# Patient Record
Sex: Male | Born: 1970 | Race: White | Hispanic: No | Marital: Single | Smoking: Current every day smoker
Health system: Southern US, Community
[De-identification: ages and names within clinical notes are randomized; demographics above are authoritative.]

## PROBLEM LIST (undated history)

## (undated) HISTORY — PX: APPENDECTOMY: SHX54

---

## 2012-05-10 ENCOUNTER — Emergency Department (HOSPITAL_COMMUNITY)
Admission: EM | Admit: 2012-05-10 | Discharge: 2012-05-10 | Disposition: A | Discharge status: Home / Self Care | Payer: Self-pay | Attending: Emergency Medicine | Admitting: Emergency Medicine

## 2012-05-10 ENCOUNTER — Encounter (HOSPITAL_COMMUNITY): Payer: Self-pay

## 2012-05-10 ENCOUNTER — Emergency Department (HOSPITAL_COMMUNITY): Payer: Self-pay

## 2012-05-10 DIAGNOSIS — IMO0002 Reserved for concepts with insufficient information to code with codable children: Secondary | ICD-10-CM | POA: Insufficient documentation

## 2012-05-10 DIAGNOSIS — Y92009 Unspecified place in unspecified non-institutional (private) residence as the place of occurrence of the external cause: Secondary | ICD-10-CM | POA: Insufficient documentation

## 2012-05-10 DIAGNOSIS — F17200 Nicotine dependence, unspecified, uncomplicated: Secondary | ICD-10-CM | POA: Insufficient documentation

## 2012-05-10 DIAGNOSIS — S62606A Fracture of unspecified phalanx of right little finger, initial encounter for closed fracture: Secondary | ICD-10-CM

## 2012-05-10 DIAGNOSIS — Y9389 Activity, other specified: Secondary | ICD-10-CM | POA: Insufficient documentation

## 2012-05-10 MED ORDER — PROMETHAZINE HCL 12.5 MG PO TABS
12.5000 mg | ORAL_TABLET | Freq: Once | ORAL | Status: AC
  Administered 2012-05-10: 12.5 mg via ORAL
  Filled 2012-05-10: qty 1

## 2012-05-10 MED ORDER — KETOROLAC TROMETHAMINE 10 MG PO TABS
10.0000 mg | ORAL_TABLET | Freq: Once | ORAL | Status: AC
  Administered 2012-05-10: 10 mg via ORAL
  Filled 2012-05-10: qty 1

## 2012-05-10 MED ORDER — OXYCODONE-ACETAMINOPHEN 5-325 MG PO TABS
1.0000 | ORAL_TABLET | Freq: Once | ORAL | Status: AC
  Administered 2012-05-10: 1 via ORAL
  Filled 2012-05-10: qty 1

## 2012-05-10 MED ORDER — OXYCODONE-ACETAMINOPHEN 5-325 MG PO TABS: 1.0000 | ORAL_TABLET | Freq: Four times a day (QID) | ORAL | Status: AC | PRN

## 2012-05-10 MED ORDER — MELOXICAM 7.5 MG PO TABS: ORAL_TABLET | ORAL | Status: DC

## 2012-05-10 NOTE — ED Provider Notes (Signed)
History     CSN: [id number]  Arrival date & time 05/10/12  1434   First MD Initiated Contact with Patient 05/10/12 1512      Chief Complaint  Patient presents with  . Hand Injury    (Consider location/radiation/quality/duration/timing/severity/associated sxs/prior treatment) Patient is a 42 y.o. male presenting with hand injury. The history is provided by the patient.  Hand Injury  The incident occurred yesterday. The incident occurred at home. The injury mechanism was a direct blow (A ratchet she slipped while the patient was working on a trailer and injured the right hand). The pain is present in the right hand. The quality of the pain is described as aching and throbbing. The pain is severe. The pain has been worsening since the incident. Pertinent negatives include no fever and no malaise/fatigue. He reports no foreign bodies present. The symptoms are aggravated by movement and palpation. He has tried NSAIDs for the symptoms. The treatment provided no relief.    History reviewed. No pertinent past medical history.  Past Surgical History  Procedure Date  . Appendectomy     No family history on file.  History  Substance Use Topics  . Smoking status: Current Every Day Smoker  . Smokeless tobacco: Not on file  . Alcohol Use: Yes      Review of Systems  Constitutional: Negative for fever, malaise/fatigue and activity change.       All ROS Neg except as noted in HPI  HENT: Negative for nosebleeds and neck pain.   Eyes: Negative for photophobia and discharge.  Respiratory: Negative for cough, shortness of breath and wheezing.   Cardiovascular: Negative for chest pain and palpitations.  Gastrointestinal: Negative for abdominal pain and blood in stool.  Genitourinary: Negative for dysuria, frequency and hematuria.  Musculoskeletal: Negative for back pain and arthralgias.       Hand pain  Skin: Negative.   Neurological: Negative for dizziness, seizures and speech  difficulty.  Psychiatric/Behavioral: Negative for hallucinations and confusion.    Allergies  Review of patient's allergies indicates not on file.  Home Medications   Current Outpatient Rx  Name  Route  Sig  Dispense  Refill  . IBUPROFEN 200 MG PO TABS   Oral   Take 400-800 mg by mouth every 3 (three) hours as needed. For pain         . MELOXICAM 7.5 MG PO TABS      1 po bid with food   12 tablet   0   . OXYCODONE-ACETAMINOPHEN 5-325 MG PO TABS   Oral   Take 1 tablet by mouth every 6 (six) hours as needed for pain.   20 tablet   0     BP 146/86  Pulse 69  Temp 98.3 F (36.8 C) (Oral)  Resp 20  Ht 5\' 11"  (1.803 m)  Wt 220 lb (99.791 kg)  BMI 30.68 kg/m2  SpO2 100%  Physical Exam  Nursing note and vitals reviewed. Constitutional: He is oriented to person, place, and time. He appears well-developed and well-nourished.  Non-toxic appearance.  HENT:  Head: Normocephalic.  Right Ear: Tympanic membrane and external ear normal.  Left Ear: Tympanic membrane and external ear normal.  Eyes: EOM and lids are normal. Pupils are equal, round, and reactive to light.  Neck: Normal range of motion. Neck supple. Carotid bruit is not present.  Cardiovascular: Normal rate, regular rhythm, normal heart sounds, intact distal pulses and normal pulses.   Pulmonary/Chest: Breath sounds normal. No respiratory  distress.  Abdominal: Soft. Bowel sounds are normal. There is no tenderness. There is no guarding.  Musculoskeletal: Normal range of motion.       There is swelling of the right fourth and fifth finger. There is severe pain to palpation or any movement of the right fifth finger. There is pain to the dorsum of the right hand extending into the right wrist forearm. There is bruising over the dorsum of the metacarpal phalangeal joint #4 and 5. Capillary refill is less than 3 seconds. Radial pulses are 2+ and symmetrical.  Lymphadenopathy:       Head (right side): No submandibular  adenopathy present.       Head (left side): No submandibular adenopathy present.    He has no cervical adenopathy.  Neurological: He is alert and oriented to person, place, and time. He has normal strength. No cranial nerve deficit or sensory deficit.  Skin: Skin is warm and dry.  Psychiatric: He has a normal mood and affect. His speech is normal.    ED Course  Procedures : FRACTURE CARE - patient identified by arm band. Permission for the procedure given by the patient. Procedural time out taken before fracture care to the right fifth finger.  The patient and significant other were educated on the fracture. Following this an ulnar gutter splint that included the DIP joint of the right fifth finger was applied. Patient received a sling. Patient received a pain medication. Patient tolerated the procedure without problem or incident.  Pulse oximetry 100% on room. Within normal limits by my interpretation. Labs Reviewed - No data to display Dg Hand Complete Right  05/10/2012  *RADIOLOGY REPORT*  Clinical Data: Hand injury, pain.  RIGHT HAND - COMPLETE 3+ VIEW  Comparison: None.  Findings: Comminuted fracture noted through the right fifth proximal phalanx.  There is extension of the fracture to near the MCP joint, possibly into the joint along the radial side.  Slight angulation and displacement.  IMPRESSION: Comminuted, slightly angulated and displaced fracture through the right fifth proximal phalanx.   Original Report Authenticated By: Charlett Nose, M.D.      1. Fracture of phalanx of right little finger       MDM  I have reviewed nursing notes, vital signs, and all appropriate lab and imaging results for this patient. The patient sustained injury to the right hand on yesterday January 5 when a ratchet slipped and hit the right hand. The x-ray reveals a comminuted slightly angulated and displaced fracture through the right fifth proximal phalanx. The patient is fitted with an ulnar gutter  splint and sling. Prescription for Percocet every 6 hours #20, and Mobic 7.5 mg twice a day #12 given to the patient. The patient is referred to  [facility]  for hand surgery referral.       Kathie Dike, [place] 05/10/12 1701

## 2012-05-10 NOTE — ED Provider Notes (Signed)
Medical screening examination/treatment/procedure(s) were performed by non-physician practitioner and as supervising physician I was immediately available for consultation/collaboration.  Erline Siddoway R. Zebulin Siegel, MD 05/10/12 2318 

## 2012-05-10 NOTE — ED Notes (Signed)
Pt reports hitting his hand on a ratchet yesterday.  Pt has bruising and some swelling to his hand and fingers.

## 2012-05-13 ENCOUNTER — Other Ambulatory Visit: Payer: Self-pay | Admitting: Orthopedic Surgery

## 2012-05-13 ENCOUNTER — Encounter (HOSPITAL_BASED_OUTPATIENT_CLINIC_OR_DEPARTMENT_OTHER): Payer: Self-pay | Admitting: *Deleted

## 2012-05-13 NOTE — Progress Notes (Signed)
To [facility] AT 0615-Hg on arrival .Npo after mn-refrain from smoking also-may take pain med if needed with small amt water only that am.Orders pending from Dr Janee Morn.

## 2012-05-14 NOTE — H&P (Signed)
Matthew Ray is an 42 y.o. male.   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  The [employer] he was using slipped and he slammed his hand forward into an object that didn't heal.  He is evaluated at the emergency department and read full, placed into a splint, and presents for additional evaluation and management.  X-rays from the time of injury are reviewed and reveal a comminuted fracture of the right small finger proximal phalanx with apex volar and radial angulation.  History reviewed. No pertinent past medical history.  Past Surgical History  Procedure Date  . Appendectomy     History reviewed. No pertinent family history. Social History:  reports that he has been smoking.  He does not have any smokeless tobacco history on file. He reports that he drinks alcohol. He reports that he does not use illicit drugs.  Allergies: No Known Allergies  No prescriptions prior to admission    No results found for this or any previous visit (from the past 48 hour(s)). No results found.  Review of Systems  All other systems reviewed and are negative.    Height 5\' 11"  (1.803 m), weight 99.791 kg (220 lb). Physical Exam  Vitals: Refer to EMR. Constitutional:  WD, WN, NAD HEENT:  NCAT, EOMI Neuro/Psych:  Alert & oriented to person, place, and time; appropriate mood & affect Lymphatic: No generalized UE edema or lymphadenopathy Extremities / MSK:  Both UE are normal with respect to appearance, ranges of motion, joint stability, muscle strength/tone, sensation, & perfusion except as otherwise noted:  Splint is removed the right hand.  The right small finger does not appear to have a significant malrotation deformity.  Overall it appears fairly straight, but flexion is slightly limited at the MP joint both by  pain and likely by extension through the fracture.  Labs / Xrays: 2 views of the right small finger obtained fluoroscopically reveal good alignment in the coronal plane, with 30 apex volar angulation still present.  Assessment:  Displaced and angulated right small finger proximal phalanx fracture  Plan:  Discussed these findings with the patient.  I recommended closed reduction and pinning versus open reduction if needed.  He understands.  We will attempt to do this on Monday as an outpatient.    The details of the operative procedure were discussed with the patient.  Questions were invited and answered.  In addition to the goal of the procedure, the risks of the procedure to include but not limited to bleeding; infection; damage to the nerves or blood vessels that could result in bleeding, numbness, weakness, chronic pain, and the need for additional procedures; stiffness; the need for revision surgery; and anesthetic risks, the worst of which is death, were reviewed.  No specific outcome was guaranteed or implied.  Informed consent was obtained.  Prescriptions for postoperative analgesia were also written.  A return appointment 7-10 days postop was made.  He was placed back into a splint awaiting surgery next week.   Work status: All interested parties should please consider this patient to be out of work entirely if no work is available that complies with the restrictions detailed below.  If these restrictions result in the patient being out of work entirely, the employer is expected to provide documentation of such to all interested third  parties such as disability insurance companies: No right-handed work for at least 2 weeks.  Math Brazie A. 05/14/2012, 4:52 PM

## 2012-05-17 ENCOUNTER — Ambulatory Visit (HOSPITAL_COMMUNITY): Payer: Self-pay

## 2012-05-17 ENCOUNTER — Ambulatory Visit (HOSPITAL_BASED_OUTPATIENT_CLINIC_OR_DEPARTMENT_OTHER): Payer: Self-pay | Admitting: Anesthesiology

## 2012-05-17 ENCOUNTER — Encounter (HOSPITAL_BASED_OUTPATIENT_CLINIC_OR_DEPARTMENT_OTHER)
Admission: RE | Disposition: A | Discharge status: Home / Self Care | Payer: Self-pay | Source: Ambulatory Visit | Attending: Orthopedic Surgery

## 2012-05-17 ENCOUNTER — Encounter (HOSPITAL_BASED_OUTPATIENT_CLINIC_OR_DEPARTMENT_OTHER): Payer: Self-pay

## 2012-05-17 ENCOUNTER — Encounter (HOSPITAL_BASED_OUTPATIENT_CLINIC_OR_DEPARTMENT_OTHER): Payer: Self-pay | Admitting: Anesthesiology

## 2012-05-17 ENCOUNTER — Ambulatory Visit (HOSPITAL_BASED_OUTPATIENT_CLINIC_OR_DEPARTMENT_OTHER)
Admission: RE | Admit: 2012-05-17 | Discharge: 2012-05-17 | Disposition: A | Discharge status: Home / Self Care | Payer: Self-pay | Source: Ambulatory Visit | Attending: Orthopedic Surgery | Admitting: Orthopedic Surgery

## 2012-05-17 DIAGNOSIS — Y990 Civilian activity done for income or pay: Secondary | ICD-10-CM | POA: Insufficient documentation

## 2012-05-17 DIAGNOSIS — W240XXA Contact with lifting devices, not elsewhere classified, initial encounter: Secondary | ICD-10-CM | POA: Insufficient documentation

## 2012-05-17 DIAGNOSIS — IMO0002 Reserved for concepts with insufficient information to code with codable children: Secondary | ICD-10-CM | POA: Insufficient documentation

## 2012-05-17 DIAGNOSIS — Y9389 Activity, other specified: Secondary | ICD-10-CM | POA: Insufficient documentation

## 2012-05-17 HISTORY — PX: CLOSED REDUCTION FINGER WITH PERCUTANEOUS PINNING: SHX5612

## 2012-05-17 LAB — POCT HEMOGLOBIN-HEMACUE: Hemoglobin: 15.6 g/dL (ref 13.0–17.0)

## 2012-05-17 SURGERY — CLOSED REDUCTION, FINGER, WITH PERCUTANEOUS PINNING
Anesthesia: General | Site: Finger | Laterality: Right | Wound class: Clean

## 2012-05-17 MED ORDER — OXYCODONE-ACETAMINOPHEN 5-325 MG PO TABS
1.0000 | ORAL_TABLET | ORAL | Status: DC | PRN
  Administered 2012-05-17: 1 via ORAL
  Filled 2012-05-17: qty 2

## 2012-05-17 MED ORDER — LACTATED RINGERS IV SOLN
INTRAVENOUS | Status: DC
  Administered 2012-05-17 (×3): via INTRAVENOUS
  Filled 2012-05-17: qty 1000

## 2012-05-17 MED ORDER — FENTANYL CITRATE 0.05 MG/ML IJ SOLN
25.0000 ug | INTRAMUSCULAR | Status: DC | PRN
  Administered 2012-05-17 (×3): 50 ug via INTRAVENOUS
  Filled 2012-05-17: qty 1

## 2012-05-17 MED ORDER — HYDROCODONE-ACETAMINOPHEN 5-325 MG PO TABS
1.0000 | ORAL_TABLET | ORAL | Status: DC | PRN
  Filled 2012-05-17: qty 2

## 2012-05-17 MED ORDER — MIDAZOLAM HCL 5 MG/5ML IJ SOLN
INTRAMUSCULAR | Status: DC | PRN
  Administered 2012-05-17: 2 mg via INTRAVENOUS

## 2012-05-17 MED ORDER — PROMETHAZINE HCL 25 MG/ML IJ SOLN
6.2500 mg | INTRAMUSCULAR | Status: DC | PRN
  Filled 2012-05-17: qty 1

## 2012-05-17 MED ORDER — FENTANYL CITRATE 0.05 MG/ML IJ SOLN
50.0000 ug | Freq: Once | INTRAMUSCULAR | Status: AC
  Administered 2012-05-17: 50 ug via INTRAVENOUS
  Filled 2012-05-17: qty 1

## 2012-05-17 MED ORDER — CEFAZOLIN SODIUM-DEXTROSE 2-3 GM-% IV SOLR
2.0000 g | INTRAVENOUS | Status: AC
  Administered 2012-05-17: 2 g via INTRAVENOUS
  Filled 2012-05-17: qty 50

## 2012-05-17 MED ORDER — FENTANYL CITRATE 0.05 MG/ML IJ SOLN
INTRAMUSCULAR | Status: DC | PRN
  Administered 2012-05-17 (×2): 50 ug via INTRAVENOUS
  Administered 2012-05-17: 25 ug via INTRAVENOUS
  Administered 2012-05-17: 50 ug via INTRAVENOUS
  Administered 2012-05-17: 25 ug via INTRAVENOUS

## 2012-05-17 MED ORDER — ONDANSETRON HCL 4 MG/2ML IJ SOLN
INTRAMUSCULAR | Status: DC | PRN
  Administered 2012-05-17: 4 mg via INTRAVENOUS

## 2012-05-17 MED ORDER — MEPERIDINE HCL 25 MG/ML IJ SOLN
6.2500 mg | INTRAMUSCULAR | Status: DC | PRN
  Filled 2012-05-17: qty 1

## 2012-05-17 MED ORDER — HYDROMORPHONE HCL PF 1 MG/ML IJ SOLN
0.5000 mg | INTRAMUSCULAR | Status: DC | PRN
  Filled 2012-05-17: qty 1

## 2012-05-17 MED ORDER — PROPOFOL 10 MG/ML IV BOLUS
INTRAVENOUS | Status: DC | PRN
  Administered 2012-05-17: 300 mg via INTRAVENOUS

## 2012-05-17 MED ORDER — DEXAMETHASONE SODIUM PHOSPHATE 4 MG/ML IJ SOLN
INTRAMUSCULAR | Status: DC | PRN
  Administered 2012-05-17: 10 mg via INTRAVENOUS

## 2012-05-17 MED ORDER — LACTATED RINGERS IV SOLN
INTRAVENOUS | Status: DC
  Filled 2012-05-17: qty 1000

## 2012-05-17 MED ORDER — LIDOCAINE HCL (CARDIAC) 20 MG/ML IV SOLN
INTRAVENOUS | Status: DC | PRN
  Administered 2012-05-17: 80 mg via INTRAVENOUS

## 2012-05-17 SURGICAL SUPPLY — 51 items
BANDAGE COBAN STERILE 2 (GAUZE/BANDAGES/DRESSINGS) IMPLANT
BANDAGE CONFORM 2  STR LF (GAUZE/BANDAGES/DRESSINGS) IMPLANT
BANDAGE CONFORM 3  STR LF (GAUZE/BANDAGES/DRESSINGS) IMPLANT
BANDAGE ELASTIC 6 VELCRO ST LF (GAUZE/BANDAGES/DRESSINGS) ×2 IMPLANT
BANDAGE GAUZE ELAST BULKY 4 IN (GAUZE/BANDAGES/DRESSINGS) ×2 IMPLANT
BLADE MINI RND TIP GREEN BEAV (BLADE) IMPLANT
BLADE OSC/SAG .038X5.5 CUT EDG (BLADE) IMPLANT
BLADE SURG 15 STRL LF DISP TIS (BLADE) ×1 IMPLANT
BLADE SURG 15 STRL SS (BLADE) ×1
BNDG COHESIVE 1X5 TAN STRL LF (GAUZE/BANDAGES/DRESSINGS) IMPLANT
BNDG COHESIVE 4X5 TAN NS LF (GAUZE/BANDAGES/DRESSINGS) ×2 IMPLANT
BNDG ESMARK 4X9 LF (GAUZE/BANDAGES/DRESSINGS) IMPLANT
CANISTER SUCTION 1200CC (MISCELLANEOUS) IMPLANT
CANISTER SUCTION 2500CC (MISCELLANEOUS) IMPLANT
CHLORAPREP W/TINT 26ML (MISCELLANEOUS) ×2 IMPLANT
CLOTH BEACON ORANGE TIMEOUT ST (SAFETY) ×2 IMPLANT
CORDS BIPOLAR (ELECTRODE) IMPLANT
COVER MAYO STAND STRL (DRAPES) ×2 IMPLANT
COVER TABLE BACK 60X90 (DRAPES) ×2 IMPLANT
DRAPE EXTREMITY T 121X128X90 (DRAPE) ×2 IMPLANT
DRAPE U-SHAPE 47X51 STRL (DRAPES) ×2 IMPLANT
DRSG EMULSION OIL 3X3 NADH (GAUZE/BANDAGES/DRESSINGS) ×4 IMPLANT
ELECT NEEDLE BLADE 2-5/6 (NEEDLE) IMPLANT
GLOVE BIO SURGEON STRL SZ 6.5 (GLOVE) ×4 IMPLANT
GLOVE BIO SURGEON STRL SZ7.5 (GLOVE) ×2 IMPLANT
GLOVE INDICATOR 8.0 STRL GRN (GLOVE) ×2 IMPLANT
GOWN PREVENTION PLUS LG XLONG (DISPOSABLE) ×2 IMPLANT
GOWN PREVENTION PLUS XLARGE (GOWN DISPOSABLE) ×2 IMPLANT
GOWN SURGICAL LARGE (GOWNS) ×2 IMPLANT
K-WIRE PROS .028 4 (WIRE) IMPLANT
KWIRE 4.0 X .045IN (WIRE) ×2 IMPLANT
KWIRE 4.0 X .062IN (WIRE) IMPLANT
NEEDLE HYPO 25X1 1.5 SAFETY (NEEDLE) IMPLANT
NS IRRIG 500ML POUR BTL (IV SOLUTION) IMPLANT
PACK BASIN DAY SURGERY FS (CUSTOM PROCEDURE TRAY) ×2 IMPLANT
PADDING CAST ABS 3INX4YD NS (CAST SUPPLIES) ×1
PADDING CAST ABS 4INX4YD NS (CAST SUPPLIES) ×1
PADDING CAST ABS COTTON 3X4 (CAST SUPPLIES) ×1 IMPLANT
PADDING CAST ABS COTTON 4X4 ST (CAST SUPPLIES) ×1 IMPLANT
SLEEVE SCD COMPRESS KNEE MED (MISCELLANEOUS) IMPLANT
SLING ARM FOAM STRAP LRG (SOFTGOODS) IMPLANT
SLING ARM FOAM STRAP MED (SOFTGOODS) IMPLANT
SPLINT PLASTER CAST XFAST 3X15 (CAST SUPPLIES) IMPLANT
SPLINT PLASTER CAST XFAST 4X15 (CAST SUPPLIES) ×8 IMPLANT
SPLINT PLASTER XTRA FAST SET 4 (CAST SUPPLIES) ×8
SPLINT PLASTER XTRA FASTSET 3X (CAST SUPPLIES)
SPONGE GAUZE 4X4 12PLY (GAUZE/BANDAGES/DRESSINGS) ×2 IMPLANT
SUT VICRYL RAPIDE 4/0 PS 2 (SUTURE) IMPLANT
SYRINGE 10CC LL (SYRINGE) ×2 IMPLANT
UNDERPAD 30X30 INCONTINENT (UNDERPADS AND DIAPERS) ×2 IMPLANT
WATER STERILE IRR 500ML POUR (IV SOLUTION) ×2 IMPLANT

## 2012-05-17 NOTE — Transfer of Care (Signed)
Immediate Anesthesia Transfer of Care Note  Patient: Matthew Ray  Procedure(s) Performed: Procedure(s) (LRB): CLOSED REDUCTION FINGER WITH PERCUTANEOUS PINNING (Right)  Patient Location: Patient transported to PACU with oxygen via face mask at 4 Liters / Min  Anesthesia Type: General  Level of Consciousness: awake and alert   Airway & Oxygen Therapy: Patient Spontanous Breathing and Patient connected to face mask oxygen  Post-op Assessment: Report given to PACU RN and Post -op Vital signs reviewed and stable  Post vital signs: Reviewed and stable  Dentition: Teeth and oropharynx remain in pre-op condition  Complications: No apparent anesthesia complications

## 2012-05-17 NOTE — Anesthesia Preprocedure Evaluation (Addendum)
Anesthesia Evaluation  Patient identified by MRN, date of birth, ID band Patient awake    Reviewed: Allergy & Precautions, H&P , NPO status , Patient's Chart, lab work & pertinent test results  Airway Mallampati: II TM Distance: >3 FB Neck ROM: Full    Dental No notable dental hx. (+) Partial Upper   Pulmonary Current Smoker,  breath sounds clear to auscultation  Pulmonary exam normal       Cardiovascular negative cardio ROS  Rhythm:Regular Rate:Normal     Neuro/Psych negative neurological ROS  negative psych ROS   GI/Hepatic negative GI ROS, Neg liver ROS,   Endo/Other  negative endocrine ROS  Renal/GU negative Renal ROS  negative genitourinary   Musculoskeletal negative musculoskeletal ROS (+)   Abdominal   Peds negative pediatric ROS (+)  Hematology negative hematology ROS (+)   Anesthesia Other Findings   Reproductive/Obstetrics negative OB ROS                          Anesthesia Physical Anesthesia Plan  ASA: II  Anesthesia Plan: General   Post-op Pain Management:    Induction: Intravenous  Airway Management Planned: LMA  Additional Equipment:   Intra-op Plan:   Post-operative Plan: Extubation in OR  Informed Consent: I have reviewed the patients History and Physical, chart, labs and discussed the procedure including the risks, benefits and alternatives for the proposed anesthesia with the patient or authorized representative who has indicated his/her understanding and acceptance.   Dental advisory given  Plan Discussed with: CRNA  Anesthesia Plan Comments:         Anesthesia Quick Evaluation

## 2012-05-17 NOTE — Anesthesia Procedure Notes (Signed)
Procedure Name: LMA Insertion Date/Time: 05/17/2012 8:18 AM Performed by: Fran Lowes Pre-anesthesia Checklist: Patient identified, Emergency Drugs available, Suction available and Patient being monitored Patient Re-evaluated:Patient Re-evaluated prior to inductionOxygen Delivery Method: Circle System Utilized Preoxygenation: Pre-oxygenation with 100% oxygen Intubation Type: IV induction Ventilation: Mask ventilation without difficulty LMA: LMA inserted LMA Size: 5.0 Number of attempts: 1 Airway Equipment and Method: bite block Placement Confirmation: positive ETCO2 Tube secured with: Tape Dental Injury: Teeth and Oropharynx as per pre-operative assessment

## 2012-05-17 NOTE — Anesthesia Postprocedure Evaluation (Signed)
  Anesthesia Post-op Note  Patient: Matthew Ray  Procedure(s) Performed: Procedure(s) (LRB): CLOSED REDUCTION FINGER WITH PERCUTANEOUS PINNING (Right)  Patient Location: PACU  Anesthesia Type: General  Level of Consciousness: awake and alert   Airway and Oxygen Therapy: Patient Spontanous Breathing  Post-op Pain: mild  Post-op Assessment: Post-op Vital signs reviewed, Patient's Cardiovascular Status Stable, Respiratory Function Stable, Patent Airway and No signs of Nausea or vomiting  Last Vitals:  Filed Vitals:   05/17/12 0945  BP: 146/88  Pulse: 65  Temp:   Resp: 14    Post-op Vital Signs: stable   Complications: No apparent anesthesia complications

## 2012-05-17 NOTE — Op Note (Signed)
05/17/2012  9:03 AM  PATIENT:  Matthew Ray  42 y.o. male  PRE-OPERATIVE DIAGNOSIS:  Displaced right small finger proximal phalanx fracture  POST-OPERATIVE DIAGNOSIS:  Same  PROCEDURE:  CRPP  right small finger proximal phalanx fracture  SURGEON: Cliffton Asters. Janee Morn, MD  PHYSICIAN ASSISTANT: None  ANESTHESIA:  general  SPECIMENS:  None  DRAINS:   None  PREOPERATIVE INDICATIONS:  Matthew Ray is a  42 y.o. male with a diagnosis of a displaced right small finger proximal phalanx fracture, unacceptable for continued treatment in that alignment  The risks benefits and alternatives were discussed with the patient preoperatively including but not limited to the risks of infection, bleeding, nerve injury, cardiopulmonary complications, the need for revision surgery, among others, and the patient verbalized understanding and consented to proceed.  OPERATIVE IMPLANTS: 2 different 0.045 inch K wires in a crossed configuration  OPERATIVE FINDINGS: Near anatomic reduction following closed reduction and secured with percutaneous pinning  OPERATIVE PROCEDURE:  After receiving prophylactic antibiotics, the patient was escorted to the operative theatre and placed in a supine position.  General anesthesia was a Optician, dispensing. A surgical "time-out" was performed during which the planned procedure, proposed operative site, and the correct patient identity were compared to the operative consent and agreement confirmed by the circulating nurse according to current facility policy.  The splint was removed and the limb was pre-scrub with Hibiclens scrub brush. Following application of a tourniquet to the operative extremity, the exposed skin was prepped with Chloraprep and draped in the usual sterile fashion.  The limb was not exsanguinated.  Under fluoroscopic guidance, the fracture was manipulated and closed reduction could be obtained. 2 different 0.045 inch K wires were introduced with the MP joints flexed.  These were introduced into the radial and ulnar aspects of the base of the proximal phalanx and secured the reduction in a crossed configuration. Final images were obtained. The pins were bent and clipped. A bulky hand dressing was applied with a volar plaster component. The digit had no malrotation following reduction and pinning.  DISPOSITION: The patient will be discharged home today with typical postop instructions, returning in 10-15 days for reassessment, with new x-rays of the right small finger out of the splint and transition to hand therapy to have a custom molded plastic splint constructed for protection and initiation of range of motion exercises.

## 2012-05-18 ENCOUNTER — Encounter (HOSPITAL_BASED_OUTPATIENT_CLINIC_OR_DEPARTMENT_OTHER): Payer: Self-pay | Admitting: Orthopedic Surgery

## 2016-02-28 ENCOUNTER — Emergency Department (HOSPITAL_COMMUNITY)
Admission: EM | Admit: 2016-02-28 | Discharge: 2016-02-28 | Disposition: A | Discharge status: Home / Self Care | Payer: Self-pay | Attending: Emergency Medicine | Admitting: Emergency Medicine

## 2016-02-28 ENCOUNTER — Emergency Department (HOSPITAL_COMMUNITY): Payer: Self-pay

## 2016-02-28 ENCOUNTER — Encounter (HOSPITAL_COMMUNITY): Payer: Self-pay | Admitting: Emergency Medicine

## 2016-02-28 DIAGNOSIS — Y939 Activity, unspecified: Secondary | ICD-10-CM | POA: Insufficient documentation

## 2016-02-28 DIAGNOSIS — M5441 Lumbago with sciatica, right side: Secondary | ICD-10-CM | POA: Insufficient documentation

## 2016-02-28 DIAGNOSIS — Y990 Civilian activity done for income or pay: Secondary | ICD-10-CM | POA: Insufficient documentation

## 2016-02-28 DIAGNOSIS — W1789XA Other fall from one level to another, initial encounter: Secondary | ICD-10-CM | POA: Insufficient documentation

## 2016-02-28 DIAGNOSIS — Y9289 Other specified places as the place of occurrence of the external cause: Secondary | ICD-10-CM | POA: Insufficient documentation

## 2016-02-28 DIAGNOSIS — M25531 Pain in right wrist: Secondary | ICD-10-CM | POA: Insufficient documentation

## 2016-02-28 DIAGNOSIS — F17200 Nicotine dependence, unspecified, uncomplicated: Secondary | ICD-10-CM | POA: Insufficient documentation

## 2016-02-28 MED ORDER — NAPROXEN 500 MG PO TABS: 500.0000 mg | ORAL_TABLET | Freq: Two times a day (BID) | ORAL | 0 refills | Status: DC

## 2016-02-28 MED ORDER — OXYCODONE-ACETAMINOPHEN 5-325 MG PO TABS
1.0000 | ORAL_TABLET | Freq: Once | ORAL | Status: AC
  Administered 2016-02-28: 1 via ORAL
  Filled 2016-02-28: qty 1

## 2016-02-28 MED ORDER — HYDROCODONE-ACETAMINOPHEN 5-325 MG PO TABS: 1.0000 | ORAL_TABLET | Freq: Four times a day (QID) | ORAL | 0 refills | Status: DC | PRN

## 2016-02-28 NOTE — ED Notes (Signed)
Patient transported to X-ray 

## 2016-02-28 NOTE — ED Triage Notes (Signed)
Pt. fell from a scaffolding at work this morning  , no LOC / ambulatory , reports pain at right wrist and lower back .

## 2016-02-28 NOTE — ED Notes (Signed)
NP at bedside.

## 2016-02-28 NOTE — ED Notes (Signed)
Pt back in room. No distress observed. 

## 2016-02-28 NOTE — ED Provider Notes (Signed)
[facility]-EMERGENCY DEPT Provider Note   CSN: [id number] Arrival date & time: 02/28/16  1950  By signing my name below, I, Linna Darner, attest that this documentation has been prepared under the direction and in the presence of Felicie Morn, NP. Electronically Signed: Linna Darner, Scribe. 02/28/2016. 8:06 PM.  History   Chief Complaint Chief Complaint  Patient presents with  . Fall    Back and Wrist pain      The history is provided by the patient. No language interpreter was used.  Fall  This is a new problem. The current episode started 6 to 12 hours ago. The problem occurs rarely. The problem has not changed since onset.Pertinent negatives include no chest pain, no abdominal pain, no headaches and no shortness of breath. Nothing aggravates the symptoms. Nothing relieves the symptoms. He has tried nothing for the symptoms.    HPI Comments: Matthew Ray is a 45 y.o. male who presents to the Emergency Department complaining of sudden onset, constant, right wrist pain s/p falling at work this morning. Pt states he fell about 15 feet from scaffolding. He states he landed on his feet and then his buttocks; he reports he braced the fall by extending his right arm to the ground. Pt denies hitting his head or losing consciousness. He also notes some lower back pain since the fall. He endorses right wrist pain exacerbation with right wrist flexion. He reports some tingling from his right lower back down to his right knee. Pt denies h/o back injury. No medications or treatments tried PTA. He denies color change, wounds, hip pain, upper back pain, neck pain, abdominal pain, nausea, vomiting, or any other associated symptoms  History reviewed. No pertinent past medical history.  There are no active problems to display for this patient.   Past Surgical History:  Procedure Laterality Date  . APPENDECTOMY    . CLOSED REDUCTION FINGER WITH PERCUTANEOUS PINNING  05/17/2012   Procedure: CLOSED  REDUCTION FINGER WITH PERCUTANEOUS PINNING;  Surgeon: Jodi Marble, MD;  Location: [facility];  Service: Orthopedics;  Laterality: Right;  Closed Reduction Percutaneous Pinning vs Open Reduction Right Small Finger Proximal Phalynx Fracture       Home Medications    Prior to Admission medications   Medication Sig Start Date End Date Taking? Authorizing Provider  ibuprofen (ADVIL,MOTRIN) 200 MG tablet Take 400-800 mg by mouth every 3 (three) hours as needed. For pain    Historical Provider, MD  meloxicam (MOBIC) 7.5 MG tablet 1 po bid with food 05/10/12   Ivery Quale, PA-C    Family History No family history on file.  Social History Social History  Substance Use Topics  . Smoking status: Current Every Day Smoker    Packs/day: 1.50  . Smokeless tobacco: Never Used  . Alcohol use Yes     Comment: social     Allergies   Review of patient's allergies indicates no known allergies.   Review of Systems Review of Systems  Respiratory: Negative for shortness of breath.   Cardiovascular: Negative for chest pain.  Gastrointestinal: Negative for abdominal pain, nausea and vomiting.  Musculoskeletal: Positive for arthralgias (right wrist) and back pain (lower). Negative for neck pain.  Skin: Negative for color change and wound.  Neurological: Positive for numbness (from right lower back to right knee). Negative for syncope and headaches.  All other systems reviewed and are negative.   Physical Exam Updated Vital Signs BP 147/91 (BP Location: Left Arm)   Pulse 65  Temp 97.9 F (36.6 C) (Oral)   Resp 18   Ht 5\' 11"  (1.803 m)   Wt 225 lb (102.1 kg)   SpO2 99%   BMI 31.38 kg/m   Physical Exam  Constitutional: He is oriented to person, place, and time. He appears well-developed and well-nourished. No distress.  HENT:  Head: Normocephalic and atraumatic.  Eyes: Conjunctivae and EOM are normal.  Neck: Neck supple. No tracheal deviation present.    Cardiovascular: Normal rate.   Pulmonary/Chest: Effort normal. No respiratory distress.  Musculoskeletal: Normal range of motion.  Pain to right medial wrist. Lumbar spine tenderness, most significantly at L1 and L2.  Neurological: He is alert and oriented to person, place, and time.  Skin: Skin is warm and dry.  Psychiatric: He has a normal mood and affect. His behavior is normal.  Nursing note and vitals reviewed.   ED Treatments / Results  Labs (all labs ordered are listed, but only abnormal results are displayed) Labs Reviewed - No data to display  EKG  EKG Interpretation None       Radiology Dg Lumbar Spine Complete  Result Date: 02/28/2016 CLINICAL DATA:  Status post fall this morning with low back pain. EXAM: LUMBAR SPINE - COMPLETE 4+ VIEW COMPARISON:  None. FINDINGS: There is no evidence of lumbar spine fracture. Alignment is normal. There are degenerative joint changes of the upper lumbar spine and lower thoracic spine with narrowed joint space and osteophyte formation. IMPRESSION: No acute fracture or dislocation. Degenerative joint changes of the spine. Electronically Signed   By: Sherian Rein M.D.   On: 02/28/2016 20:39   Dg Wrist Complete Right  Result Date: 02/28/2016 CLINICAL DATA:  Status post fall this morning with right wrist pain. EXAM: RIGHT WRIST - COMPLETE 3+ VIEW COMPARISON:  None. FINDINGS: There is no evidence of fracture or dislocation. There is a small round lucency in the scaphoid, chronic. Soft tissues are unremarkable. IMPRESSION: No acute fracture or dislocation. Electronically Signed   By: Sherian Rein M.D.   On: 02/28/2016 20:38    Procedures Procedures (including critical care time)  DIAGNOSTIC STUDIES: Oxygen Saturation is 99% on RA, normal by my interpretation.    COORDINATION OF CARE: 8:13 PM Discussed treatment plan with pt at bedside and pt agreed to plan.  Medications Ordered in ED Medications - No data to display   Initial  Impression / Assessment and Plan / ED Course  I have reviewed the triage vital signs and the nursing notes.  Pertinent labs & imaging results that were available during my care of the patient were reviewed by me and considered in my medical decision making (see chart for details).  Clinical Course  Patient X-Ray negative for obvious fracture or dislocation.  Pt advised to follow up with orthopedics. Patient given wrist splint while in ED, conservative therapy recommended and discussed.  Patient with back pain.  No neurological deficits and normal neuro exam.  Patient is ambulatory.  No loss of bowel or bladder control.  No concern for cauda equina.  No fever, night sweats, weight loss, h/o cancer, IVDA, no recent procedure to back. No urinary symptoms suggestive of UTI.  Supportive care and return precaution discussed. Appears safe for discharge at this time. Follow up as indicated in discharge paperwork.   I personally performed the services described in this documentation, which was scribed in my presence. The recorded information has been reviewed and is accurate.   Final Clinical Impressions(s) / ED Diagnoses  Final diagnoses:  Acute midline low back pain with right-sided sciatica  Acute wrist pain, right    New Prescriptions Discharge Medication List as of 02/28/2016  9:15 PM    START taking these medications   Details  HYDROcodone-acetaminophen (NORCO/VICODIN) 5-325 MG tablet Take 1 tablet by mouth every 6 (six) hours as needed for severe pain., Starting Thu 02/28/2016, Print    naproxen (NAPROSYN) 500 MG tablet Take 1 tablet (500 mg total) by mouth 2 (two) times daily., Starting Thu 02/28/2016, Print         Felicie Mornavid Smokey Melott, NP 02/28/16 [id number]    Pricilla LovelessScott Goldston, MD 03/01/16 [phone]

## 2018-05-15 ENCOUNTER — Other Ambulatory Visit: Payer: Self-pay

## 2018-05-15 ENCOUNTER — Emergency Department (HOSPITAL_COMMUNITY): Payer: Self-pay

## 2018-05-15 ENCOUNTER — Encounter (HOSPITAL_COMMUNITY): Payer: Self-pay | Admitting: Emergency Medicine

## 2018-05-15 ENCOUNTER — Emergency Department (HOSPITAL_COMMUNITY)
Admission: EM | Admit: 2018-05-15 | Discharge: 2018-05-16 | Disposition: A | Discharge status: Home / Self Care | Payer: Self-pay | Attending: Emergency Medicine | Admitting: Emergency Medicine

## 2018-05-15 DIAGNOSIS — R160 Hepatomegaly, not elsewhere classified: Secondary | ICD-10-CM | POA: Insufficient documentation

## 2018-05-15 DIAGNOSIS — F17210 Nicotine dependence, cigarettes, uncomplicated: Secondary | ICD-10-CM | POA: Insufficient documentation

## 2018-05-15 DIAGNOSIS — S22009A Unspecified fracture of unspecified thoracic vertebra, initial encounter for closed fracture: Secondary | ICD-10-CM

## 2018-05-15 DIAGNOSIS — S32029A Unspecified fracture of second lumbar vertebra, initial encounter for closed fracture: Secondary | ICD-10-CM | POA: Insufficient documentation

## 2018-05-15 DIAGNOSIS — S22089A Unspecified fracture of T11-T12 vertebra, initial encounter for closed fracture: Secondary | ICD-10-CM | POA: Insufficient documentation

## 2018-05-15 DIAGNOSIS — W11XXXA Fall on and from ladder, initial encounter: Secondary | ICD-10-CM | POA: Insufficient documentation

## 2018-05-15 DIAGNOSIS — S32039A Unspecified fracture of third lumbar vertebra, initial encounter for closed fracture: Secondary | ICD-10-CM | POA: Insufficient documentation

## 2018-05-15 DIAGNOSIS — S2241XA Multiple fractures of ribs, right side, initial encounter for closed fracture: Secondary | ICD-10-CM | POA: Insufficient documentation

## 2018-05-15 DIAGNOSIS — S32009A Unspecified fracture of unspecified lumbar vertebra, initial encounter for closed fracture: Secondary | ICD-10-CM

## 2018-05-15 DIAGNOSIS — S32019A Unspecified fracture of first lumbar vertebra, initial encounter for closed fracture: Secondary | ICD-10-CM | POA: Insufficient documentation

## 2018-05-15 DIAGNOSIS — Y9289 Other specified places as the place of occurrence of the external cause: Secondary | ICD-10-CM | POA: Insufficient documentation

## 2018-05-15 DIAGNOSIS — S298XXA Other specified injuries of thorax, initial encounter: Secondary | ICD-10-CM

## 2018-05-15 DIAGNOSIS — Y998 Other external cause status: Secondary | ICD-10-CM | POA: Insufficient documentation

## 2018-05-15 DIAGNOSIS — Y9389 Activity, other specified: Secondary | ICD-10-CM | POA: Insufficient documentation

## 2018-05-15 MED ORDER — ONDANSETRON HCL 4 MG/2ML IJ SOLN
4.0000 mg | Freq: Once | INTRAMUSCULAR | Status: AC
  Administered 2018-05-16: 4 mg via INTRAVENOUS
  Filled 2018-05-15: qty 2

## 2018-05-15 MED ORDER — MORPHINE SULFATE (PF) 4 MG/ML IV SOLN
4.0000 mg | Freq: Once | INTRAVENOUS | Status: AC
  Administered 2018-05-16: 4 mg via INTRAVENOUS
  Filled 2018-05-15: qty 1

## 2018-05-15 NOTE — ED Triage Notes (Signed)
Pt c/o right rib pain after falling in a gravel driveway tonight, denies LOC or hitting head

## 2018-05-16 LAB — COMPREHENSIVE METABOLIC PANEL
ALK PHOS: 65 U/L (ref 38–126)
ALT: 25 U/L (ref 0–44)
AST: 28 U/L (ref 15–41)
Albumin: 4.1 g/dL (ref 3.5–5.0)
Anion gap: 5 (ref 5–15)
BUN: 11 mg/dL (ref 6–20)
CALCIUM: 9.2 mg/dL (ref 8.9–10.3)
CHLORIDE: 111 mmol/L (ref 98–111)
CO2: 24 mmol/L (ref 22–32)
CREATININE: 0.99 mg/dL (ref 0.61–1.24)
GFR calc non Af Amer: >60 mL/min (ref >60)
GLUCOSE: 97 mg/dL (ref 70–99)
Potassium: 4.1 mmol/L (ref 3.5–5.1)
SODIUM: 140 mmol/L (ref 135–145)
Total Bilirubin: 0.6 mg/dL (ref 0.3–1.2)
Total Protein: 7 g/dL (ref 6.5–8.1)

## 2018-05-16 LAB — CBC
HEMATOCRIT: 45.3 % (ref 39.0–52.0)
Hemoglobin: 15.2 g/dL (ref 13.0–17.0)
MCH: 32.2 pg (ref 26.0–34.0)
MCHC: 33.6 g/dL (ref 30.0–36.0)
MCV: 96 fL (ref 80.0–100.0)
NRBC: 0 % (ref 0.0–0.2)
PLATELETS: 186 10*3/uL (ref 150–400)
RBC: 4.72 MIL/uL (ref 4.22–5.81)
RDW: 13.2 % (ref 11.5–15.5)
WBC: 15.5 10*3/uL — ABNORMAL HIGH (ref 4.0–10.5)

## 2018-05-16 MED ORDER — HYDROCODONE-ACETAMINOPHEN 5-325 MG PO TABS
1.0000 | ORAL_TABLET | Freq: Four times a day (QID) | ORAL | 0 refills | Status: AC | PRN
Start: 2018-05-16 — End: ?

## 2018-05-16 MED ORDER — IOPAMIDOL (ISOVUE-300) INJECTION 61%
100.0000 mL | Freq: Once | INTRAVENOUS | Status: AC | PRN
  Administered 2018-05-16: 100 mL via INTRAVENOUS

## 2018-05-16 MED ORDER — HYDROMORPHONE HCL 1 MG/ML IJ SOLN
INTRAMUSCULAR | Status: AC
  Filled 2018-05-16: qty 1

## 2018-05-16 MED ORDER — HYDROMORPHONE HCL 1 MG/ML IJ SOLN
1.0000 mg | Freq: Once | INTRAMUSCULAR | Status: AC
  Administered 2018-05-16: 1 mg via INTRAVENOUS

## 2018-05-16 MED ORDER — KETOROLAC TROMETHAMINE 30 MG/ML IJ SOLN
30.0000 mg | Freq: Once | INTRAMUSCULAR | Status: AC
  Administered 2018-05-16: 30 mg via INTRAVENOUS
  Filled 2018-05-16: qty 1

## 2018-05-16 NOTE — ED Notes (Signed)
Pt given Sprite at this time. 

## 2018-05-16 NOTE — ED Provider Notes (Signed)
Patient seen/examined in the Emergency Department in conjunction with Midlevel Provider Idol Patient reports right chest and right upper quadrant pain after falling from ladder.  CT imaging confirms multiple rib fractures as well as transverse process fractures. Exam : Alert, appears uncomfortable but no acute distress. He is ambulatory Plan: Plan for discharge home with adequate pain control, incentive spirometer.  He was also informed of his potential liver mass and need for outpatient MRI as this could represent cancer.  Patient and significant other understand this. Discussed need for rest for several weeks to allow for healing.  Referred to neurosurgery as well as primary care.  Definitive Fracture Care - rib fracture Discussed need for adequate pain control, rest, and incentive spirometer.  Discussed need to return to ER for any fevers, cough/hemoptysis or shortness of breath.    Zadie Rhine, MD 05/16/18 Earle Gell

## 2018-05-16 NOTE — Discharge Instructions (Signed)
As we discussed you will need MRI of your liver in the next month to determine exact cause of this.

## 2018-05-16 NOTE — ED Provider Notes (Signed)
[facility] EMERGENCY DEPARTMENT Provider Note   CSN: [id number] Arrival date & time: 05/15/18  2215     History   Chief Complaint Chief Complaint  Patient presents with  . Fall    HPI Matthew Ray is a 48 y.o. male with no significant past medical history presenting with right chest wall and right upper quadrant pain after falling from an 8 foot ladder as he was cleaning gutters.  This injury occurred around 5 PM.  He fell landing on his right side against a board.  He denies head injury, LOC or any other complaints of pain.  His pain is constant, worse with movement and deep inspiration.  He denies nausea or vomiting, he has had no dizziness, shortness of breath, back or neck pain.  The history is provided by the patient.    History reviewed. No pertinent past medical history.  There are no active problems to display for this patient.   Past Surgical History:  Procedure Laterality Date  . APPENDECTOMY    . CLOSED REDUCTION FINGER WITH PERCUTANEOUS PINNING  05/17/2012   Procedure: CLOSED REDUCTION FINGER WITH PERCUTANEOUS PINNING;  Surgeon: Jodi Marble, MD;  Location: [facility];  Service: Orthopedics;  Laterality: Right;  Closed Reduction Percutaneous Pinning vs Open Reduction Right Small Finger Proximal Phalynx Fracture        Home Medications    Prior to Admission medications   Medication Sig Start Date End Date Taking? Authorizing Provider  HYDROcodone-acetaminophen (NORCO/VICODIN) 5-325 MG tablet Take 1 tablet by mouth every 6 (six) hours as needed for severe pain. 02/28/16   Felicie Morn, NP  ibuprofen (ADVIL,MOTRIN) 200 MG tablet Take 400-800 mg by mouth every 3 (three) hours as needed. For pain    [provider]  meloxicam (MOBIC) 7.5 MG tablet 1 po bid with food 05/10/12   Ivery Quale, PA-C  naproxen (NAPROSYN) 500 MG tablet Take 1 tablet (500 mg total) by mouth 2 (two) times daily. 02/28/16   Felicie Morn, NP    Family  History History reviewed. No pertinent family history.  Social History Social History   Tobacco Use  . Smoking status: Current Every Day Smoker    Packs/day: 1.50  . Smokeless tobacco: Never Used  Substance Use Topics  . Alcohol use: Not Currently  . Drug use: No     Allergies   Patient has no known allergies.   Review of Systems Review of Systems  Constitutional: Negative for chills and fever.  HENT: Negative for congestion and sore throat.   Eyes: Negative.   Respiratory: Negative for chest tightness, shortness of breath and wheezing.   Cardiovascular: Positive for chest pain.  Gastrointestinal: Positive for abdominal pain. Negative for nausea and vomiting.  Genitourinary: Negative.   Musculoskeletal: Negative for arthralgias, joint swelling and neck pain.  Skin: Negative.  Negative for rash and wound.  Neurological: Negative for dizziness, weakness, light-headedness, numbness and headaches.  Psychiatric/Behavioral: Negative.      Physical Exam Updated Vital Signs BP 120/70   Pulse (!) 55   Temp (!) 96.5 F (35.8 C) (Temporal)   Resp 16   Ht 5\' 11"  (1.803 m)   Wt 97.5 kg   SpO2 96%   BMI 29.99 kg/m   Physical Exam Vitals signs and nursing note reviewed.  Constitutional:      Appearance: He is well-developed.  HENT:     Head: Normocephalic and atraumatic.  Eyes:     Conjunctiva/sclera: Conjunctivae normal.  Neck:  Musculoskeletal: Normal range of motion.  Cardiovascular:     Rate and Rhythm: Normal rate and regular rhythm.     Heart sounds: Normal heart sounds.  Pulmonary:     Breath sounds: Decreased breath sounds present. No wheezing.       Comments: Decreased breath sounds with poor effort secondary to pain. Chest:     Chest wall: Tenderness present. No deformity, swelling, crepitus or edema.       Comments: Tender to palpation along the right lower anterior and lateral rib cage.  There is no palpable deformity or edema. Abdominal:      General: Bowel sounds are normal.     Palpations: Abdomen is soft. There is no fluid wave or mass.     Tenderness: There is abdominal tenderness in the right upper quadrant. There is guarding. There is no rebound.  Musculoskeletal: Normal range of motion.  Skin:    General: Skin is warm and dry.  Neurological:     Mental Status: He is alert.      ED Treatments / Results  Labs (all labs ordered are listed, but only abnormal results are displayed) Labs Reviewed  CBC - Abnormal; Notable for the following components:      Result Value   WBC 15.5 (*)    All other components within normal limits  COMPREHENSIVE METABOLIC PANEL    EKG None  Radiology Dg Ribs Unilateral W/chest Right  Result Date: 05/15/2018 CLINICAL DATA:  Larey Seat and injured the RIGHT ribs in a gravel driveway earlier tonight. Initial encounter. EXAM: RIGHT RIBS AND CHEST - 3+ VIEW COMPARISON:  None. FINDINGS: The site of maximum pain and tenderness was marked with a metallic BB. No RIGHT rib fractures identified. No intrinsic osseous abnormalities involving the RIGHT ribs. Cardiomediastinal silhouette unremarkable. Lungs clear. Bronchovascular markings normal. Pulmonary vascularity normal. No visible pleural effusions. No pneumothorax. IMPRESSION: 1. No RIGHT rib fractures identified. 2. No acute cardiopulmonary disease. Electronically Signed   By: Hulan Saas M.D.   On: 05/15/2018 23:41    Procedures Procedures (including critical care time)  Medications Ordered in ED Medications  morphine 4 MG/ML injection 4 mg (4 mg Intravenous Given 05/16/18 0005)  ondansetron (ZOFRAN) injection 4 mg (4 mg Intravenous Given 05/16/18 0004)  iopamidol (ISOVUE-300) 61 % injection 100 mL (100 mLs Intravenous Contrast Given 05/16/18 0051)     Initial Impression / Assessment and Plan / ED Course  I have reviewed the triage vital signs and the nursing notes.  Pertinent labs & imaging results that were available during my care of  the patient were reviewed by me and considered in my medical decision making (see chart for details).     Patient with right-sided chest and right upper quadrant abdominal pain after fall from an 8 foot ladder landing directly on his right side.  Plain film imaging was negative for rib fractures. CT imaging pending.   Dr. Bebe Shaggy assumes care and will dispo pt.  Final Clinical Impressions(s) / ED Diagnoses   Final diagnoses:  None    ED Discharge Orders    None       Victoriano Lain 05/16/18 0155    Zadie Rhine, MD 05/16/18 [phone]

## 2018-05-18 NOTE — ED Notes (Signed)
Matthew Ray Fanning writing presription for zofran and oxycodone.  Pt coming to pick up prescription because unable to eprescribe.

## 2018-05-18 NOTE — ED Notes (Signed)
Pt's friend called reporting that the hydrocodone was making him sick and wasn't helping the pain.  Notified Tammy PA and was told we could call in some zofran and pt could increase his hydrocodone to every 4 hours instead of every 6hours.  Girlfriend says he will wait until Burgess Amor comes in this afternoon.

## 2018-05-18 NOTE — ED Provider Notes (Signed)
Call from patient today stating the pain medication is not relieving his symptoms but does make him very nauseated.  He will be prescribed oxycodone and Zofran, advised to discontinue his hydrocodone.  Attempt to E scribed this was not successful given the time since his discharge.  Will have to write a prescription and the patient will need to pick up here.   Burgess Amor, PA-C 05/18/18 1728    Zadie Rhine, MD 05/21/18 1606

## 2020-04-19 IMAGING — DX DG RIBS W/ CHEST 3+V*R*
4 series · 4 of 4 positions shown · non-contrast
Comparison: None.

CLINICAL DATA: Fell and injured the RIGHT ribs in a gravel driveway
earlier tonight. Initial encounter.

EXAM:
RIGHT RIBS AND CHEST - 3+ VIEW

[chest pa]
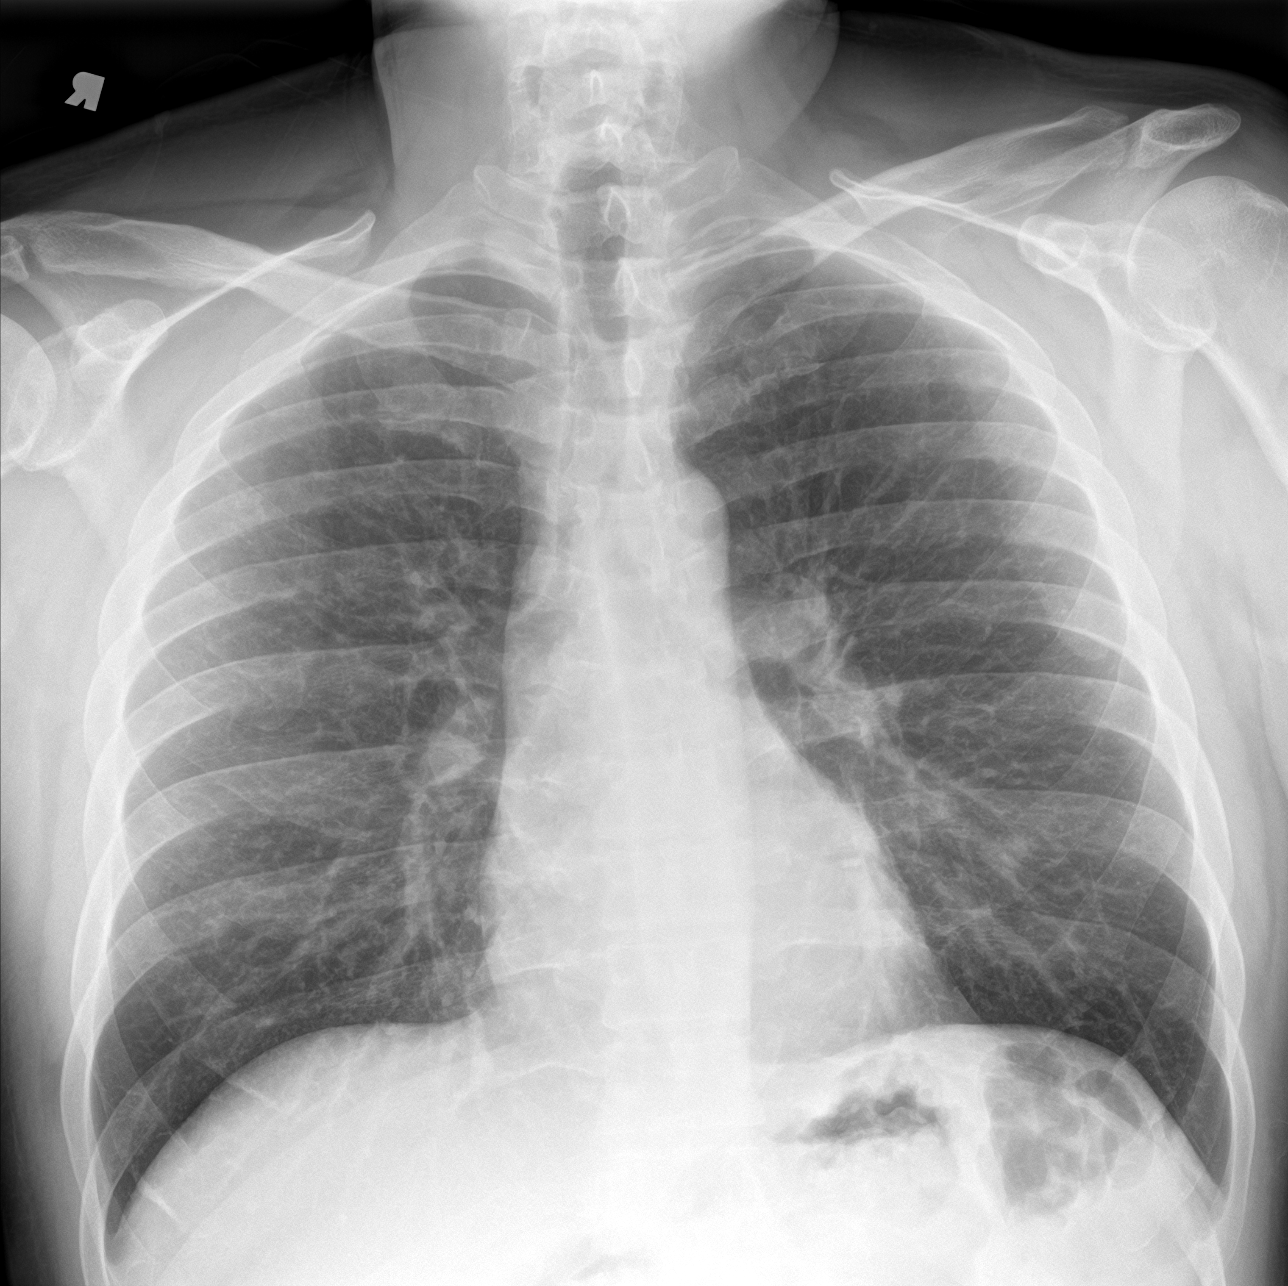

[rib pa (1 of 2)]
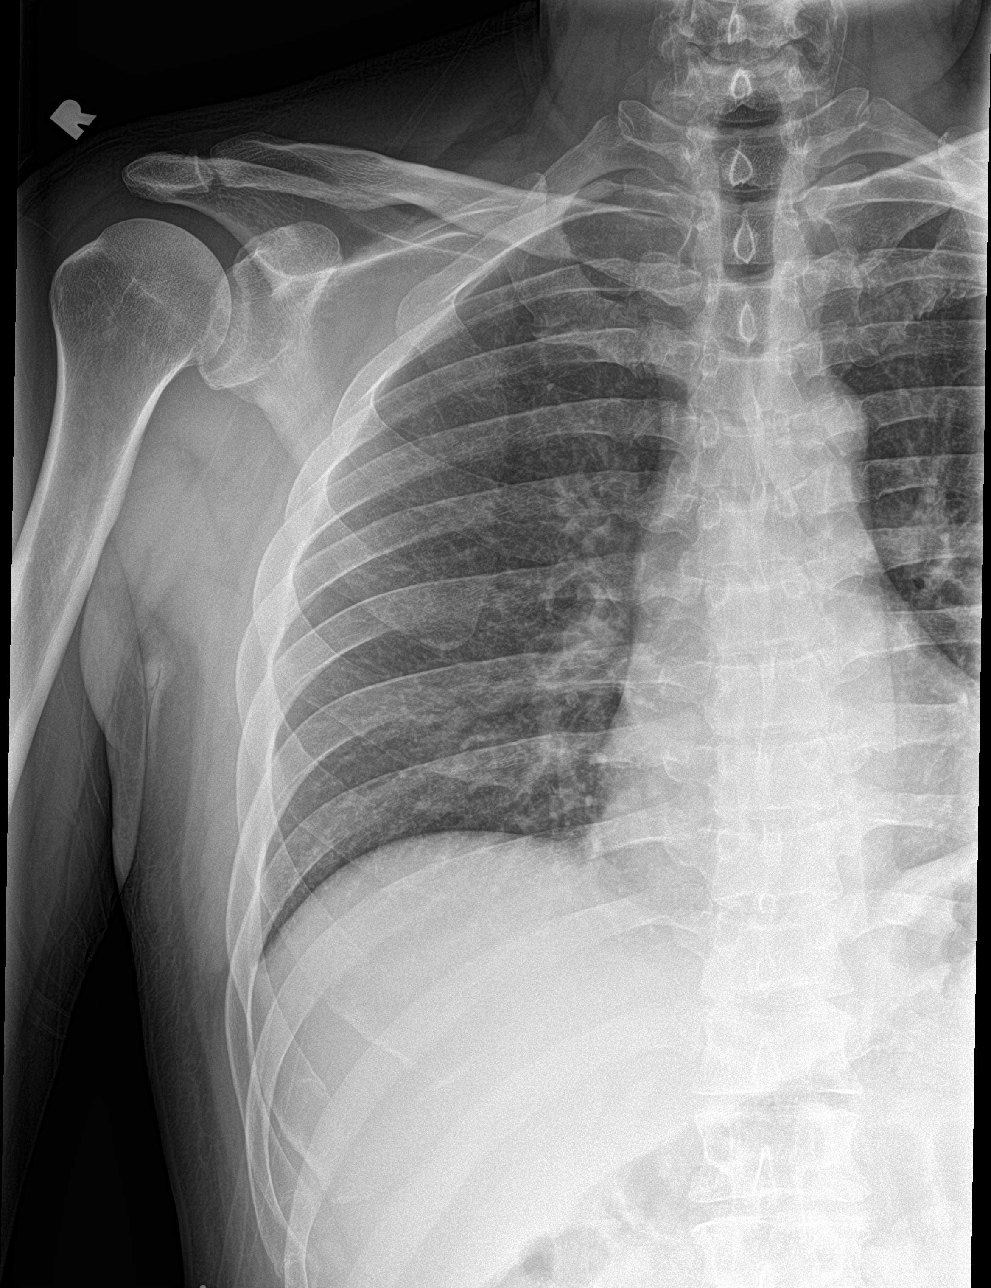

[rib pa obl]
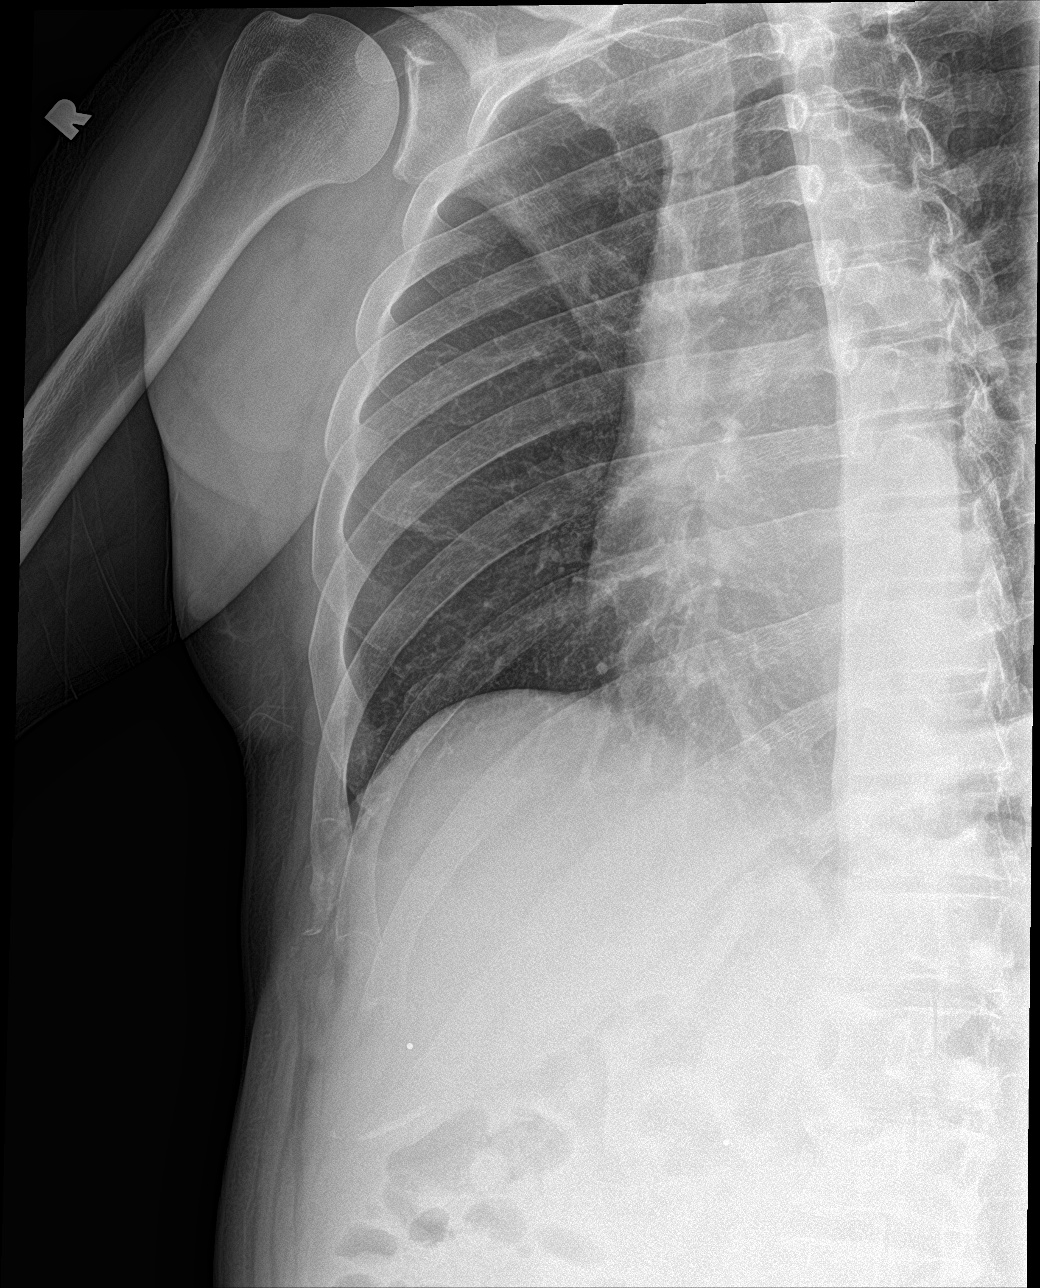

[rib pa (2 of 2)]
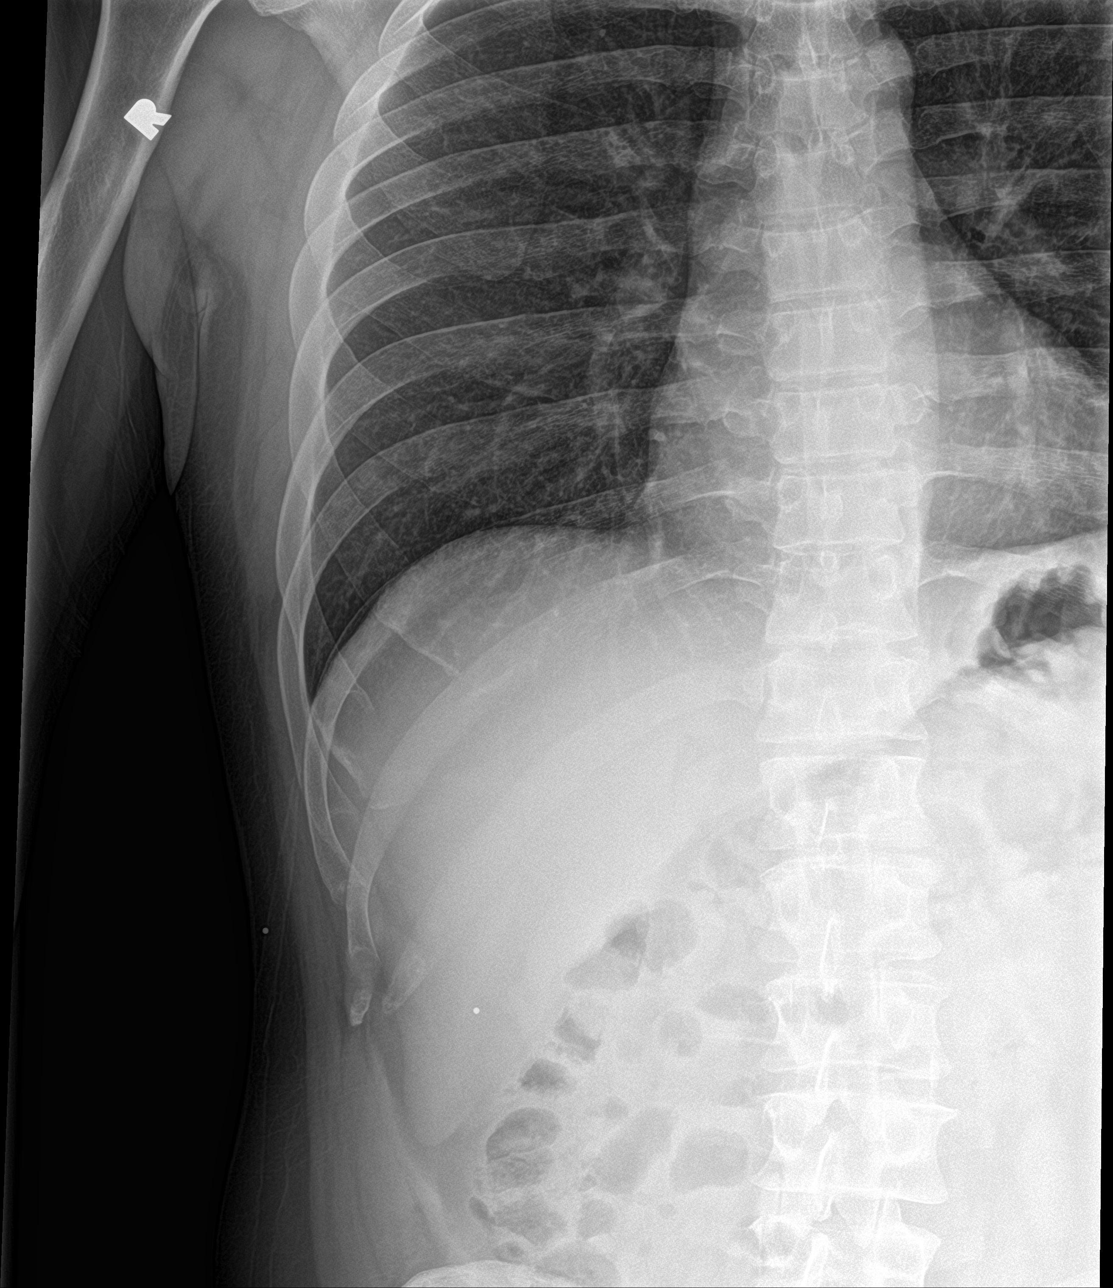

[4 of 4 positions shown; findings below may reference images not displayed]

FINDINGS: The site of maximum pain and tenderness was marked with a metallic
BB. No RIGHT rib fractures identified. No intrinsic osseous
abnormalities involving the RIGHT ribs.

Cardiomediastinal silhouette unremarkable. Lungs clear.
Bronchovascular markings normal. Pulmonary vascularity normal. No
visible pleural effusions. No pneumothorax.
IMPRESSION: 1. No RIGHT rib fractures identified.
2. No acute cardiopulmonary disease.

## 2022-11-24 ENCOUNTER — Encounter (HOSPITAL_COMMUNITY): Payer: Self-pay | Admitting: Emergency Medicine

## 2022-11-24 ENCOUNTER — Emergency Department (HOSPITAL_COMMUNITY)
Admission: EM | Admit: 2022-11-24 | Discharge: 2022-11-25 | Disposition: A | Discharge status: Home / Self Care | Payer: Self-pay | Attending: Emergency Medicine | Admitting: Emergency Medicine

## 2022-11-24 ENCOUNTER — Other Ambulatory Visit: Payer: Self-pay

## 2022-11-24 DIAGNOSIS — M5442 Lumbago with sciatica, left side: Secondary | ICD-10-CM | POA: Diagnosis not present

## 2022-11-24 DIAGNOSIS — F17210 Nicotine dependence, cigarettes, uncomplicated: Secondary | ICD-10-CM | POA: Insufficient documentation

## 2022-11-24 DIAGNOSIS — M545 Low back pain, unspecified: Secondary | ICD-10-CM | POA: Diagnosis not present

## 2022-11-24 MED ORDER — LIDOCAINE 5 % EX PTCH: 1.0000 | MEDICATED_PATCH | CUTANEOUS | 0 refills | Status: AC

## 2022-11-24 MED ORDER — HYDROMORPHONE HCL 1 MG/ML IJ SOLN
1.0000 mg | Freq: Once | INTRAMUSCULAR | Status: AC
  Administered 2022-11-24: 1 mg via INTRAMUSCULAR
  Filled 2022-11-24: qty 1

## 2022-11-24 MED ORDER — NAPROXEN 500 MG PO TABS: 500.0000 mg | ORAL_TABLET | Freq: Two times a day (BID) | ORAL | 0 refills | Status: DC

## 2022-11-24 MED ORDER — PREDNISONE 10 MG PO TABS
60.0000 mg | ORAL_TABLET | Freq: Once | ORAL | Status: AC
  Administered 2022-11-24: 60 mg via ORAL
  Filled 2022-11-24: qty 1

## 2022-11-24 MED ORDER — ACETAMINOPHEN 500 MG PO TABS
1000.0000 mg | ORAL_TABLET | Freq: Once | ORAL | Status: AC
  Administered 2022-11-24: 1000 mg via ORAL

## 2022-11-24 MED ORDER — METHOCARBAMOL 500 MG PO TABS: 500.0000 mg | ORAL_TABLET | Freq: Three times a day (TID) | ORAL | 0 refills | Status: AC | PRN

## 2022-11-24 MED ORDER — PREDNISONE 20 MG PO TABS: 40.0000 mg | ORAL_TABLET | Freq: Every day | ORAL | 0 refills | Status: AC

## 2022-11-24 NOTE — ED Provider Notes (Signed)
[facility]-EMERGENCY DEPT [facility] Emergency Department Provider Note MRN:  [id number]  Arrival date & time: 11/24/22     Chief Complaint   Back Pain   History of Present Illness   Matthew Ray is a 52 y.o. year-old male with no pertinent past medical history presenting to the ED with chief complaint of back pain.  Pain to the midline and left lumbar back with radiation down the back of the left leg.  Present for a few days.  No numbness or weakness to the arms or legs, no bowel or bladder dysfunction, no fever.  No recent trauma.  Review of Systems  A thorough review of systems was obtained and all systems are negative except as noted in the HPI and PMH.   Patient's Health History   History reviewed. No pertinent past medical history.  Past Surgical History:  Procedure Laterality Date   APPENDECTOMY     CLOSED REDUCTION FINGER WITH PERCUTANEOUS PINNING  05/17/2012   Procedure: CLOSED REDUCTION FINGER WITH PERCUTANEOUS PINNING;  Surgeon: Jodi Marble, MD;  Location: [facility];  Service: Orthopedics;  Laterality: Right;  Closed Reduction Percutaneous Pinning vs Open Reduction Right Small Finger Proximal Phalynx Fracture    History reviewed. No pertinent family history.  Social History   Socioeconomic History   Marital status: Single    Spouse name: Not on file   Number of children: Not on file   Years of education: Not on file   Highest education level: Not on file  Occupational History   Not on file  Tobacco Use   Smoking status: Every Day    Current packs/day: 1.50    Types: Cigarettes   Smokeless tobacco: Never  Vaping Use   Vaping status: Never Used  Substance and Sexual Activity   Alcohol use: Not Currently   Drug use: No   Sexual activity: Not on file  Other Topics Concern   Not on file  Social History Narrative   Not on file   Social Determinants of Health   Financial Resource Strain: Not on file  Food Insecurity: Not on file   Transportation Needs: Not on file  Physical Activity: Not on file  Stress: Not on file  Social Connections: Not on file  Intimate Partner Violence: Not on file     Physical Exam   Vitals:   11/24/22 1943 11/24/22 2234  BP: (!) 134/92 (!) 147/92  Pulse: 71 (!) 58  Resp: 16   Temp: 98.8 F (37.1 C) 98.2 F (36.8 C)  SpO2: 99% 100%    CONSTITUTIONAL: Well-appearing, NAD NEURO/PSYCH:  Alert and oriented x 3, no focal deficits EYES:  eyes equal and reactive ENT/NECK:  no LAD, no JVD CARDIO: Regular rate, well-perfused, normal S1 and S2 PULM:  CTAB no wheezing or rhonchi GI/GU:  non-distended, non-tender MSK/SPINE:  No gross deformities, no edema SKIN:  no rash, atraumatic   *Additional and/or pertinent findings included in MDM below  Diagnostic and Interventional Summary    EKG Interpretation Date/Time:    Ventricular Rate:    PR Interval:    QRS Duration:    QT Interval:    QTC Calculation:   R Axis:      Text Interpretation:         Labs Reviewed - No data to display  No orders to display    Medications  HYDROmorphone (DILAUDID) injection 1 mg (has no administration in time range)  predniSONE (DELTASONE) tablet 60 mg (has no administration in  time range)  acetaminophen (TYLENOL) tablet 1,000 mg (has no administration in time range)     Procedures  /  Critical Care Procedures  ED Course and Medical Decision Making  Initial Impression and Ddx Reassuring neurological exam, reassuring vital signs, no signs or symptoms of myelopathy.  History and physical exam most suggestive of sciatica.  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  No recent trauma to warrant imaging.  Past medical/surgical history that increases complexity of ED encounter: None  Interpretation of Diagnostics Laboratory and/or imaging options to aid in the diagnosis/care of the patient were considered.  After careful history and physical examination, it was  determined that there was no indication for diagnostics at this time.  Patient Reassessment and Ultimate Disposition/Management     Discharge  Patient management required discussion with the following services or consulting groups:  None  Complexity of Problems Addressed Acute complicated illness or Injury  Additional Data Reviewed and Analyzed Further history obtained from: Further history from spouse/family member  Additional Factors Impacting ED Encounter Risk Prescriptions  Elmer Sow. Pilar Plate, MD [facility] Emergency Medicine [facility] [email or web address]  Final Clinical Impressions(s) / ED Diagnoses     ICD-10-CM   1. Acute midline low back pain with left-sided sciatica  M54.42       ED Discharge Orders          Ordered    predniSONE (DELTASONE) 20 MG tablet  Daily        11/24/22 2337    methocarbamol (ROBAXIN) 500 MG tablet  Every 8 hours PRN        11/24/22 2337    naproxen (NAPROSYN) 500 MG tablet  2 times daily,   Status:  Discontinued        11/24/22 2337    lidocaine (LIDODERM) 5 %  Every 24 hours        11/24/22 2337             Discharge Instructions Discussed with and Provided to Patient:    Discharge Instructions      You were evaluated in the Emergency Department and after careful evaluation, we did not find any emergent condition requiring admission or further testing in the hospital.  Your exam/testing today is overall reassuring.  Symptoms may be due to sciatica.  Take the steroid medication daily, start this prescription morning of July 24.  Continue Tylenol 1000 mg every 4-6 hours for pain.  Use the Lidoderm numbing patches daily.  Can use the Robaxin muscle relaxer for more significant pain but use caution as this medication can cause drowsiness.  Please return to the Emergency Department if you experience any worsening of your condition.   Thank you for allowing [place] to be a part of your care.      Sabas Sous,  MD 11/24/22 [phone]

## 2022-11-24 NOTE — ED Triage Notes (Signed)
Pt via POV c/o lower back pain x 2 days, unsure of mechanism of injury. Pt took a 10mg  flexeril around 8am with no relief of pain. Ambulatory with a limp.

## 2022-11-24 NOTE — Discharge Instructions (Addendum)
You were evaluated in the Emergency Department and after careful evaluation, we did not find any emergent condition requiring admission or further testing in the hospital.  Your exam/testing today is overall reassuring.  Symptoms may be due to sciatica.  Take the steroid medication daily, start this prescription morning of July 24.  Continue Tylenol 1000 mg every 4-6 hours for pain.  Use the Lidoderm numbing patches daily.  Can use the Robaxin muscle relaxer for more significant pain but use caution as this medication can cause drowsiness.  Please return to the Emergency Department if you experience any worsening of your condition.   Thank you for allowing [place] to be a part of your care.
